# Patient Record
Sex: Male | Born: 1970 | Race: White | Hispanic: No | Marital: Married | Smoking: Former smoker
Health system: Southern US, Community
[De-identification: ages and names within clinical notes are randomized; demographics above are authoritative.]

## PROBLEM LIST (undated history)

## (undated) DIAGNOSIS — I10 Essential (primary) hypertension: Secondary | ICD-10-CM

## (undated) HISTORY — PX: BACK SURGERY: SHX140

---

## 2016-04-20 ENCOUNTER — Emergency Department
Admission: EM | Admit: 2016-04-20 | Discharge: 2016-04-20 | Disposition: A | Discharge status: Home / Self Care | Payer: BLUE CROSS/BLUE SHIELD | Attending: Emergency Medicine | Admitting: Emergency Medicine

## 2016-04-20 ENCOUNTER — Encounter: Payer: Self-pay | Admitting: Emergency Medicine

## 2016-04-20 ENCOUNTER — Emergency Department: Payer: BLUE CROSS/BLUE SHIELD

## 2016-04-20 DIAGNOSIS — Z87891 Personal history of nicotine dependence: Secondary | ICD-10-CM | POA: Insufficient documentation

## 2016-04-20 DIAGNOSIS — I10 Essential (primary) hypertension: Secondary | ICD-10-CM | POA: Diagnosis not present

## 2016-04-20 DIAGNOSIS — R0789 Other chest pain: Secondary | ICD-10-CM | POA: Diagnosis present

## 2016-04-20 DIAGNOSIS — M79602 Pain in left arm: Secondary | ICD-10-CM | POA: Diagnosis not present

## 2016-04-20 DIAGNOSIS — R079 Chest pain, unspecified: Secondary | ICD-10-CM

## 2016-04-20 HISTORY — DX: Essential (primary) hypertension: I10

## 2016-04-20 LAB — COMPREHENSIVE METABOLIC PANEL
ALBUMIN: 3.9 g/dL (ref 3.5–5.0)
ALT: 54 U/L (ref 17–63)
ANION GAP: 7 (ref 5–15)
AST: 35 U/L (ref 15–41)
Alkaline Phosphatase: 88 U/L (ref 38–126)
BILIRUBIN TOTAL: 0.7 mg/dL (ref 0.3–1.2)
BUN: 16 mg/dL (ref 6–20)
CALCIUM: 9 mg/dL (ref 8.9–10.3)
CO2: 25 mmol/L (ref 22–32)
Chloride: 107 mmol/L (ref 101–111)
Creatinine, Ser: 1.21 mg/dL (ref 0.61–1.24)
Glucose, Bld: 236 mg/dL — ABNORMAL HIGH (ref 65–99)
POTASSIUM: 3.9 mmol/L (ref 3.5–5.1)
Sodium: 139 mmol/L (ref 135–145)
TOTAL PROTEIN: 6.7 g/dL (ref 6.5–8.1)

## 2016-04-20 LAB — CBC
HEMATOCRIT: 44.4 % (ref 40.0–52.0)
Hemoglobin: 15.7 g/dL (ref 13.0–18.0)
MCH: 30.4 pg (ref 26.0–34.0)
MCHC: 35.4 g/dL (ref 32.0–36.0)
MCV: 86 fL (ref 80.0–100.0)
Platelets: 242 10*3/uL (ref 150–440)
RBC: 5.16 MIL/uL (ref 4.40–5.90)
RDW: 13 % (ref 11.5–14.5)
WBC: 7.3 10*3/uL (ref 3.8–10.6)

## 2016-04-20 LAB — TROPONIN I

## 2016-04-20 MED ORDER — ASPIRIN 81 MG PO CHEW
324.0000 mg | CHEWABLE_TABLET | Freq: Once | ORAL | Status: AC
  Administered 2016-04-20: 324 mg via ORAL
  Filled 2016-04-20: qty 4

## 2016-04-20 NOTE — ED Provider Notes (Signed)
[facility] Emergency Department Provider Note ____________________________________________   I have reviewed the triage vital signs and the triage nursing note.  HISTORY  Chief Complaint Chest Pain   Historian Patient and wife  HPI Kenneth Quinn is a 46 y.o. male here for evaluation of intermittent central and left-sided chest pains which are sharp and not associated with breathing or time of day or eating. He also has occasional/intermittent left forearm and possibly up to the bicep pains which are shooting at times. Not associated with overuse or trauma. Sometimes will sometimes not associated with the chest discomfort. No shortness of breath. No pleuritic chest pain. No recent cough congestion. No indigestion. Patient's brother just died unexpectedly of a heart attack. The funeral is this afternoon.  The family was driving from [place], and has another 4 hours to go and around 8:30 this morning he started experiencing some left arm discomfort and left and central chest discomfort similar to prior episodes over the past several months. Symptoms are currently gone now. He's had several episodes over the past week as well.  Prior smoker, currently smokes very intermittently. He has been relatively healthy and does not follow closely with her primary care doctor, but would be able to follow up with someone.  No prior diagnosis of high cholesterol or high blood pressure, although patient and wife state he's had some high blood pressure, but never treated.  Patient self states that he thinks maybe he is "wasting our time" and that he is just anxious especially what happened with his brother. He also gives additional history later on that his biological father had some issue with thinning of a blood vessel wall and points to his chest and that the male members of the family are supposed to be getting ultrasounds. It doesn't sound like he's had an "ultrasound".    Past  Medical History:  Diagnosis Date  . Hypertension     There are no active problems to display for this patient.   Past Surgical History:  Procedure Laterality Date  . BACK SURGERY      Prior to Admission medications   Not on File    No Known Allergies  No family history on file.  Social History Social History  Substance Use Topics  . Smoking status: Former Games developer  . Smokeless tobacco: Current User  . Alcohol use Not on file    Review of Systems  Constitutional: Negative for fever. Eyes: Negative for visual changes. ENT: Negative for sore throat. Cardiovascular: Negative for palpitations.Marland Kitchen Respiratory: Negative for shortness of breath. Gastrointestinal: Negative for abdominal pain, vomiting and diarrhea. Genitourinary: Negative for dysuria. Musculoskeletal: Negative for back pain. Skin: Negative for rash. Neurological: Negative for headache. 10 point Review of Systems otherwise negative ____________________________________________   PHYSICAL EXAM:  VITAL SIGNS: ED Triage Vitals  Enc Vitals Group     BP 04/20/16 0945 (!) 168/102     Pulse Rate 04/20/16 0945 (!) 103     Resp 04/20/16 0945 20     Temp 04/20/16 0945 98.6 F (37 C)     Temp Source 04/20/16 0945 Oral     SpO2 04/20/16 0945 94 %     Weight 04/20/16 0947 215 lb (97.5 kg)     Height 04/20/16 0947 6' (1.829 m)     Head Circumference --      Peak Flow --      Pain Score 04/20/16 0940 0     Pain Loc --  Pain Edu? --      Excl. in GC? --      Constitutional: Alert and oriented. Well appearing and in no distress. HEENT   Head: Normocephalic and atraumatic.      Eyes: Conjunctivae are normal. PERRL. Normal extraocular movements.      Ears:         Nose: No congestion/rhinnorhea.   Mouth/Throat: Mucous membranes are moist.   Neck: No stridor. Cardiovascular/Chest: Normal rate, regular rhythm.  No murmurs, rubs, or gallops. Respiratory: Normal respiratory effort without tachypnea  nor retractions. Breath sounds are clear and equal bilaterally. No wheezes/rales/rhonchi. Gastrointestinal: Soft. No distention, no guarding, no rebound. Nontender.    Genitourinary/rectal:Deferred Musculoskeletal: Nontender with normal range of motion in all extremities. No joint effusions.  No lower extremity tenderness.  No edema. Neurologic:  Normal speech and language. No gross or focal neurologic deficits are appreciated. Skin:  Skin is warm, dry and intact. No rash noted. Psychiatric: Mood and affect are normal. Speech and behavior are normal. Patient exhibits appropriate insight and judgment.   ____________________________________________  LABS (pertinent positives/negatives)  Labs Reviewed  COMPREHENSIVE METABOLIC PANEL - Abnormal; Notable for the following:       Result Value   Glucose, Bld 236 (*)    All other components within normal limits  CBC  TROPONIN I    ____________________________________________    EKG I, Governor Rooks, MD, the attending physician have personally viewed and interpreted all ECGs.  102 bpm Sinus tachycardia.  No acute distress. Normal axis. Normal ST and T-wave ____________________________________________  RADIOLOGY All Xrays were viewed by me. Imaging interpreted by Radiologist.  Chest Xray portable: normal appearance __________________________________________  PROCEDURES  Procedure(s) performed: None  Critical Care performed: None  ____________________________________________   ED COURSE / ASSESSMENT AND PLAN  Pertinent labs & imaging results that were available during my care of the patient were reviewed by me and considered in my medical decision making (see chart for details).   [name] has nonspecific chest discomfort and occasional and intermittent left arm discomfort which sounds like it's been over months, and he hasn't paid any attention to it, but since his brother died unexpectedly of a heart attack, he became more  concerned about the symptoms and wanted to have it checked out. He is actually driving on his way to his brother's funeral this afternoon at 4 PM.  He had about 1 hour of intermittent left-sided chest discomfort and left arm discomfort, sounds like the arm happens more than the left side of the chest. No associated symptoms.  In terms of risk factors, there is apparently a family history, and he probably has hypertension, untreated - states home bp usually 130/90.  Unknown if high cholesterol.  He's a symptomatic now. His EKG is reassuring and normal in appearance essentially. He is slightly tachycardic initially, but 90s throughout his visit.  Patient states that he feels like he is overreacting and just wanted to be checked to be safe given what happened with his brother.  He gets some vague history of possible risk for aortic aneurysm or dissection, but today he certainly does not seem to have any symptoms consistent with a rupturing aortic aneurysm or dissection.    Symptoms are fairly atypical for coronary artery disease. I discussed with him recommendation for initial evaluation and then repeat troponin. However he is in some bit of time constraints given they have 4 more hours to drive and the brother's funeral starts at 4 PM. Patient and  wife do not want to stay for repeat troponin testing. They understand it was my recommendation, and understand that if he gets any worse he should return to the emergency department.  We discussed outpatient follow-up, they are from [facility] and feel they can make an appointment.  I did provide the cardiology on call if they have any trouble.   CONSULTATIONS:  None  Patient / Family / Caregiver informed of clinical course, medical decision-making process, and agree with plan.   I discussed return precautions, follow-up instructions, and discharge instructions with patient and/or family.  Discharge instructions:  You were evaluated for intermittent  central and left-sided chest discomfort and left-sided arm pain. Although no certain cause was found, your exam and evaluation are reassuring in the emergency room and tape.  As we discussed, I recommended that you stay for repeat blood work, but you chose to leave now. As we discussed, return to emergency department immediately for any new or worsening chest pain, abdominal pain, trouble breathing, dizziness, passing out, headache, weakness, numbness, fever, or any other symptoms concerning to you.  Your blood pressure was elevated in the ER.  Please check and keep track of your blood pressure so you can share with your doctor to discuss possible need for treatment.  Take baby aspirin (81 mg) daily until seen and reevaluated by the cardiologist.  You need close follow up with your primary care doctor for evaluation of elevated blood pressure and other primary care evaluation including cholesterol, etc.  You also need to see a cardiologist in close follow-up, please call the office on Monday for an appointment within the next few days.  ___________________________________________   FINAL CLINICAL IMPRESSION(S) / ED DIAGNOSES   Final diagnoses:  Nonspecific chest pain  Left arm pain  Hypertension, unspecified type              Note: This dictation was prepared with Dragon dictation. Any transcriptional errors that result from this process are unintentional    Governor Rooks, MD 04/20/16 1137

## 2016-04-20 NOTE — ED Triage Notes (Signed)
Was driving to funeral, chest pain L chest and arm while driving, stopped here for eval. States pain relieved in triage.

## 2016-04-20 NOTE — Discharge Instructions (Signed)
You were evaluated for intermittent central and left-sided chest discomfort and left-sided arm pain. Although no certain cause was found, your exam and evaluation are reassuring in the emergency room and tape.  As we discussed, I recommended that you stay for repeat blood work, but you chose to leave now. As we discussed, return to emergency department immediately for any new or worsening chest pain, abdominal pain, trouble breathing, dizziness, passing out, headache, weakness, numbness, fever, or any other symptoms concerning to you.  Your blood pressure was elevated in the ER.  Please check and keep track of your blood pressure so you can share with your doctor to discuss possible need for treatment.  Take baby aspirin (81 mg) daily until seen and reevaluated by the cardiologist.  You need close follow up with your primary care doctor for evaluation of elevated blood pressure and other primary care evaluation including cholesterol, etc.  You also need to see a cardiologist in close follow-up, please call the office on Monday for an appointment within the next few days.

## 2016-04-26 ENCOUNTER — Telehealth: Payer: Self-pay

## 2016-04-26 NOTE — Telephone Encounter (Signed)
Lmov for patient to call back and schedule an appointment he was seen in ED on 04/20/16 for CP °Will try again at a later time °

## 2016-05-04 NOTE — Telephone Encounter (Signed)
Lmov for patient to call back and schedule an appointment he was seen in ED on 04/20/16 for CP Will try again at a later time

## 2016-05-09 NOTE — Telephone Encounter (Signed)
Lmov for patient to call back and schedule an appointment he was seen in ED on 04/20/16 for CP Will try again at a later time

## 2016-05-18 NOTE — Telephone Encounter (Signed)
Sent letter

## 2017-10-18 IMAGING — DX DG CHEST 1V PORT
2 series · 2 of 2 positions shown · non-contrast
Comparison: None.

CLINICAL DATA: High blood pressure with left chest pain.

EXAM:
PORTABLE CHEST 1 VIEW

[chest ap (1 of 2)]
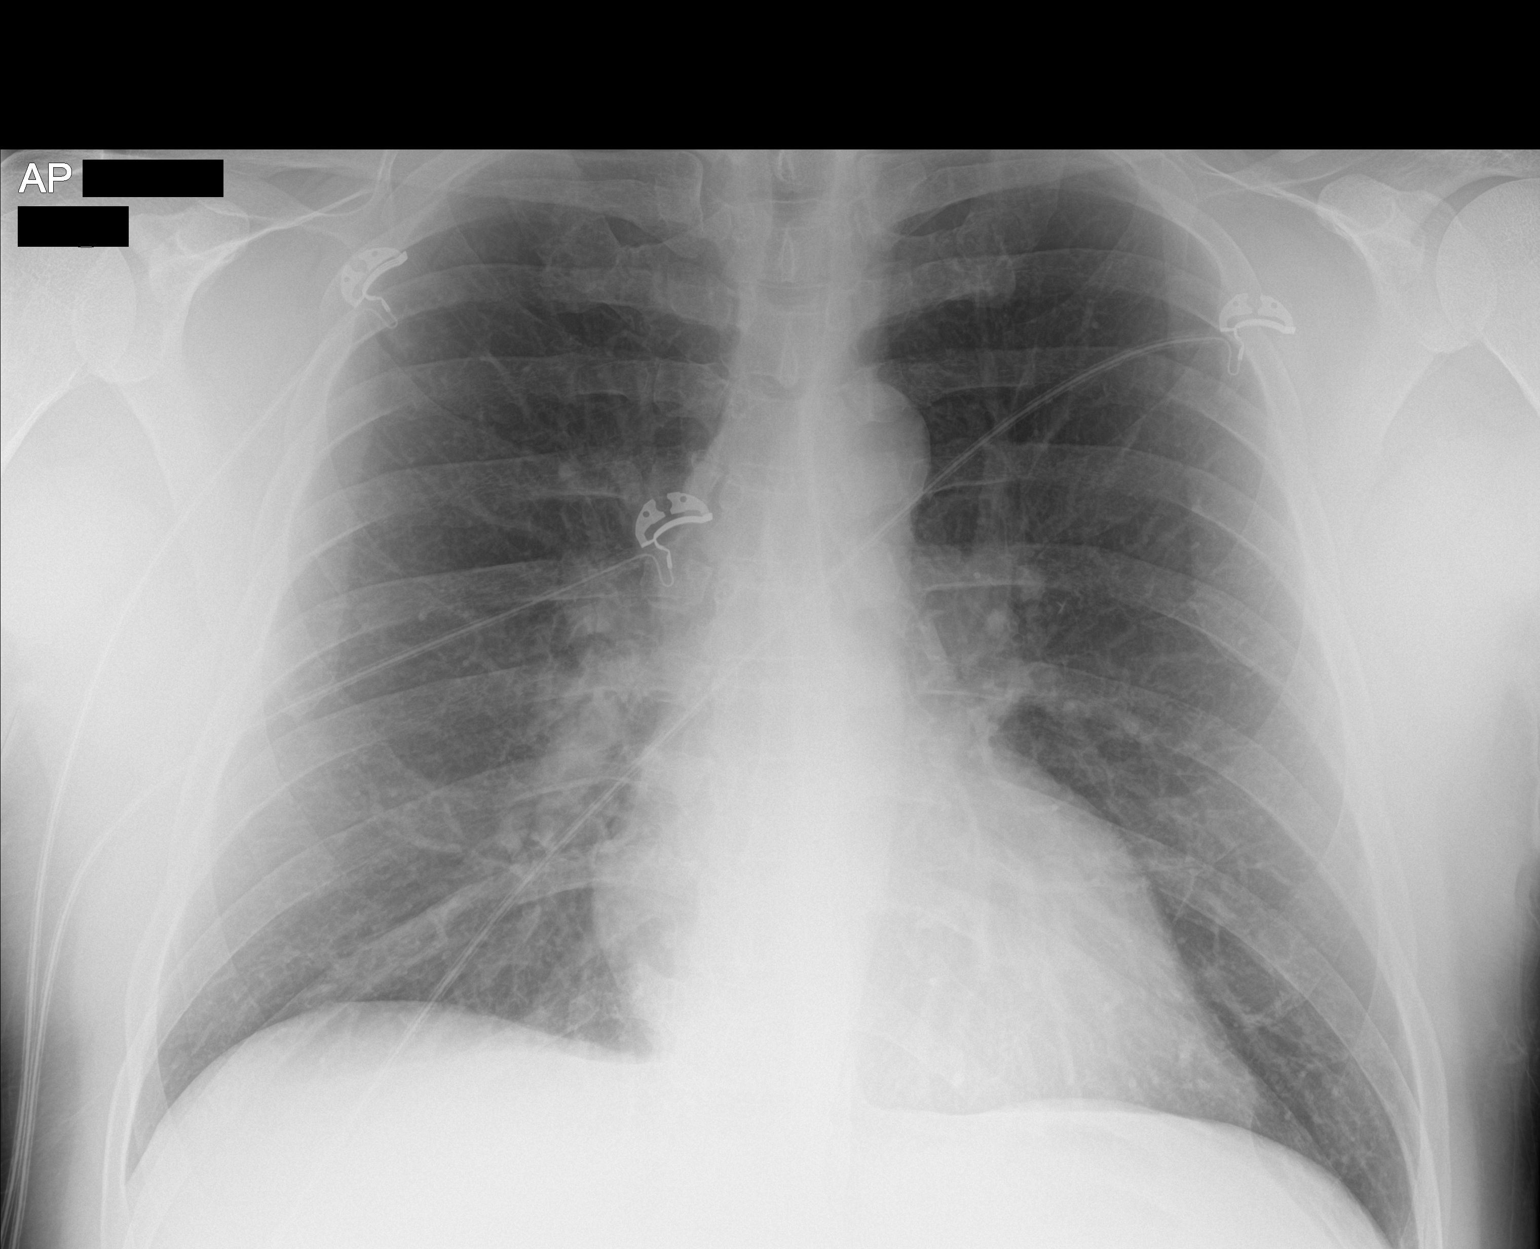

[chest ap (2 of 2)]
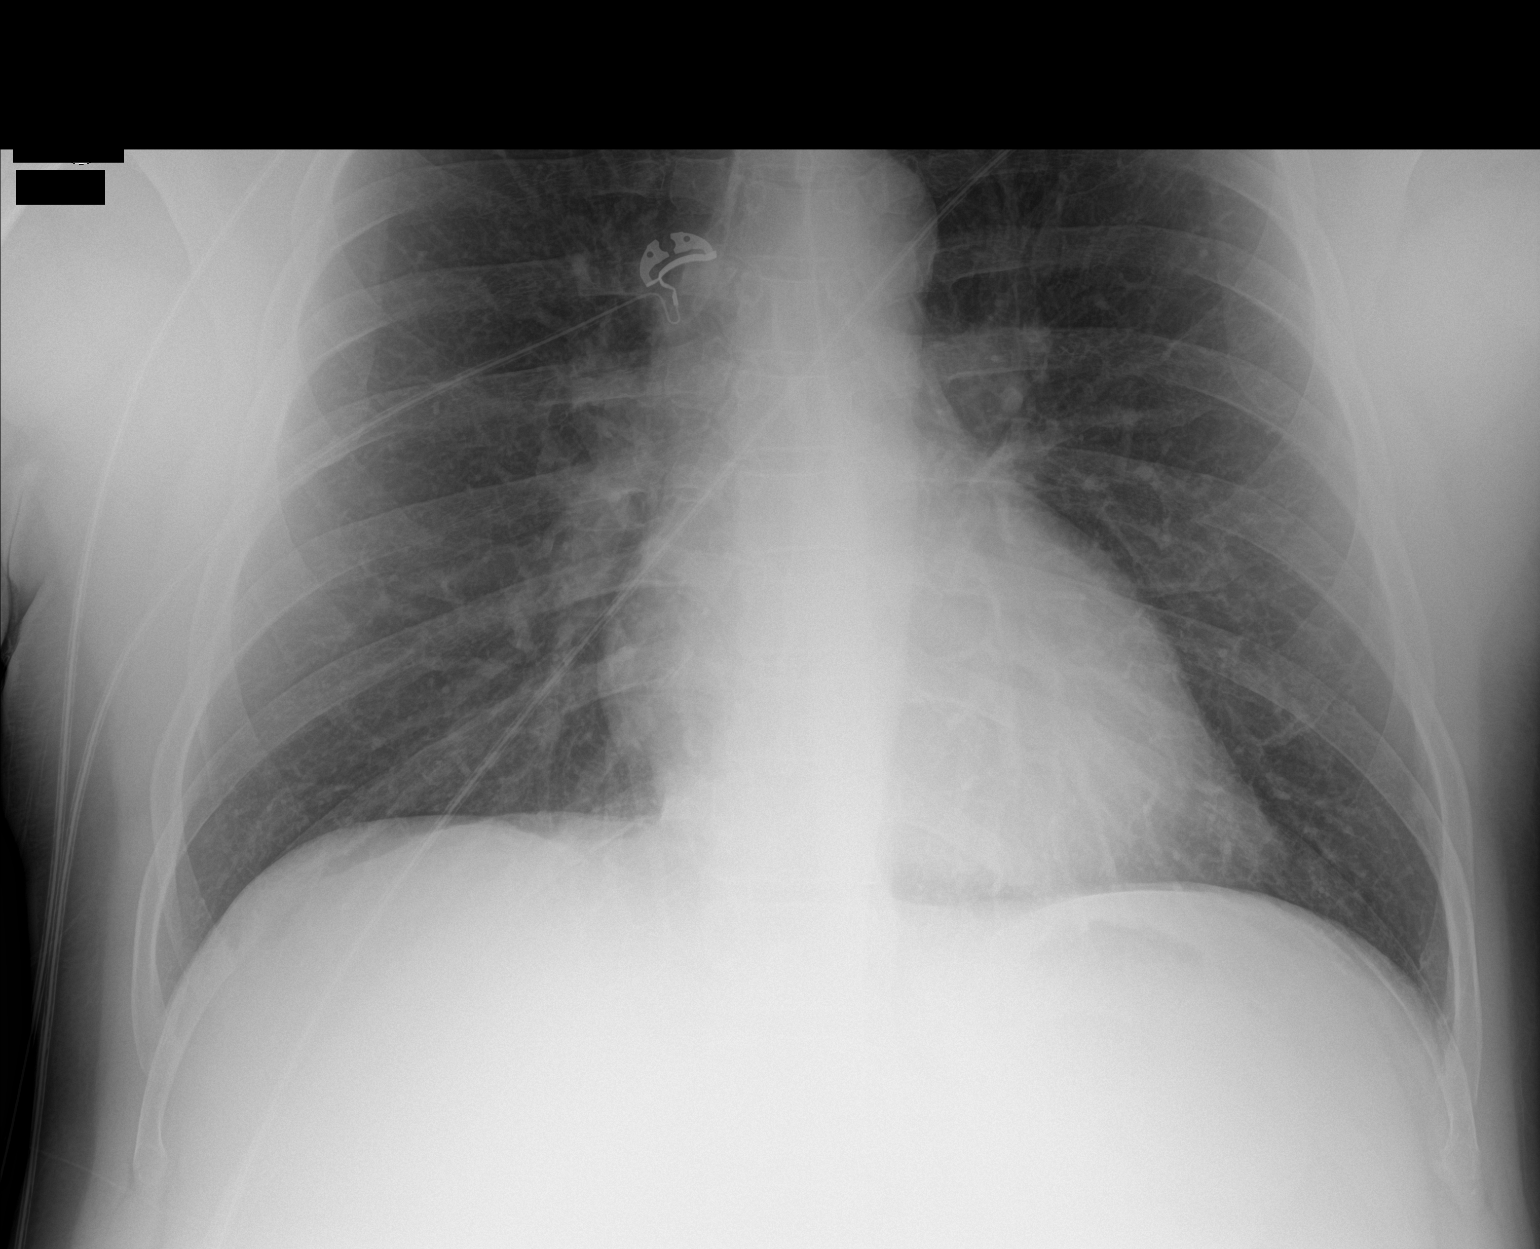

[2 of 2 positions shown; findings below may reference images not displayed]

FINDINGS: 5422 hours. The lungs are clear wiithout focal pneumonia, edema,
pneumothorax or pleural effusion. Cardiopericardial silhouette is at
upper limits of normal for size. The visualized bony structures of
the thorax are intact. Telemetry leads overlie the chest.
IMPRESSION: No active disease.
